# Patient Record
Sex: Female | Born: 2000
Health system: Southern US, Community
[De-identification: ages and names within clinical notes are randomized; demographics above are authoritative.]

---
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
## 2022-03-04 DIAGNOSIS — F410 Panic disorder [episodic paroxysmal anxiety] without agoraphobia: Secondary | ICD-10-CM | POA: Diagnosis not present

## 2022-03-04 DIAGNOSIS — Z720 Tobacco use: Secondary | ICD-10-CM | POA: Diagnosis not present

## 2022-03-04 DIAGNOSIS — R5383 Other fatigue: Secondary | ICD-10-CM | POA: Diagnosis not present

## 2022-03-04 DIAGNOSIS — F411 Generalized anxiety disorder: Secondary | ICD-10-CM | POA: Diagnosis not present

## 2022-03-04 DIAGNOSIS — L659 Nonscarring hair loss, unspecified: Secondary | ICD-10-CM | POA: Diagnosis not present

## 2022-03-09 ENCOUNTER — Other Ambulatory Visit: Payer: Self-pay

## 2022-03-09 MED ORDER — BUSPIRONE HCL 5 MG PO TABS
ORAL_TABLET | ORAL | 1 refills | Status: AC
  Filled 2022-03-09: qty 60, 34d supply, fill #0

## 2022-03-15 ENCOUNTER — Other Ambulatory Visit: Payer: Self-pay

## 2022-03-15 ENCOUNTER — Encounter: Payer: Self-pay | Admitting: Dermatology

## 2022-03-15 ENCOUNTER — Ambulatory Visit: Payer: 59 | Admitting: Dermatology

## 2022-03-15 DIAGNOSIS — L659 Nonscarring hair loss, unspecified: Secondary | ICD-10-CM

## 2022-03-15 DIAGNOSIS — L700 Acne vulgaris: Secondary | ICD-10-CM

## 2022-03-15 MED ORDER — CLINDAMYCIN PHOSPHATE 1 % EX SOLN
1.0000 | Freq: Every day | CUTANEOUS | 2 refills | Status: DC
  Filled 2022-03-15: qty 30, 30d supply, fill #0

## 2022-03-15 MED ORDER — TRETINOIN 0.025 % EX CREA
1.0000 "application " | TOPICAL_CREAM | Freq: Every day | CUTANEOUS | 2 refills | Status: AC
  Filled 2022-03-15: qty 45, 30d supply, fill #0

## 2022-03-15 NOTE — Patient Instructions (Addendum)
Shave Excision Benign Lesion Wound Care Instructions  Leave the original bandage on for 24 hours if possible.  If the bandage becomes soaked or soiled before that time, it is OK to remove it and examine the wound.  A small amount of post-operative bleeding is normal.  If excessive bleeding occurs, remove the bandage, place gauze over the site and apply continuous pressure (no peeking) over the area for 20-30 minutes.  If this does not stop the bleeding, try again for 40 minutes.  If this does not work, please call our clinic as soon as possible (even if after-hours).    Twice a day, cleanse the wound with soap and water.  If a thick crust develops you may use a Q-tip dipped into dilute hydrogen peroxide (mix 1:1 with water) to dissolve it.  Hydrogen peroxide can slow the healing process, so use it only as needed.  After washing, apply Vaseline jelly or Polysporin ointment.  For best healing, the wound should be covered with a layer of ointment at all times.  This may mean re-applying the ointment several times a day.  For open wounds, continue until it has healed.    If you have any swelling, keep the area elevated.  Some redness, tenderness and white or yellow material in the wound is normal healing.  If the area becomes very sore and red, or develops a thick yellow-green material (pus), it may be infected; please notify us.    Wound healing continues for up to one year following surgery.  It is not unusual to experience pain in the scar from time to time during the interval.  If the pain becomes severe or the scar thickens, you should notify the office.  A slight amount of redness in a scar is expected for the first six months.  After six months, the redness subsides and the scar will soften and fade.  The color difference becomes less noticeable with time.  If there are any problems, return for a post-op surgery check at your earliest convenience.  Please call our office for any questions or  concerns.    Topical retinoid medications like tretinoin/Retin-A, adapalene/Differin, tazarotene/Fabior, and Epiduo/Epiduo Forte can cause dryness and irritation when first started. Only apply a pea-sized amount to the entire affected area. Avoid applying it around the eyes, edges of mouth and creases at the nose. If you experience irritation, use a good moisturizer first and/or apply the medicine less often. If you are doing well with the medicine, you can increase how often you use it until you are applying every night. Be careful with sun protection while using this medication as it can make you sensitive to the sun. This medicine should not be used by pregnant women.

## 2022-03-15 NOTE — Progress Notes (Signed)
New Patient Visit  Subjective  Brandi Morton is a 22 y.o. female who presents for the following: Alopecia (Bilateral temple hair thinning. Worse with stress. Takes viviscal x1 yr & has helped. Started Vit D 10,000 IU last week instructed to decrease to 5000 IU after 1 mth. /Father has female pattern hair loss. Reg menses, no hx of PCOS. Tangerine size ovarian cyst Dr. Marcelline Mates at [facility] monitoring since Oct 2023) and Acne (Worse in last 9mhs).    Objective  Well appearing patient in no apparent distress; mood and affect are within normal limits.  A focused examination was performed including scalp. Relevant physical exam findings are noted in the Assessment and Plan.  Chin Inflammatory papules on chin   Right temporal scalp Hair thinning bilateral temples      Assessment & Plan  Acne vulgaris Chin  Apply Clindamycin to whole face in the morning Apply Tretinoin to the whole face in the evening on Monday, Wednesday, and Friday  Daily Regimen Template:  Morning: 1. Wash your face with a gentle cleanser (Cetaphil, CeraVe, Neutrogena) 2. Apply a pea-sized amount of  CLINDAMYCIN 1% LOTION  to face 3. Follow with a moisturizer and sunscreen suitable for acne-prone skin.  Evening: 1. Wash your face with a gentle cleanser (Cetaphil, CeraVe, Neutrogena) 2. Apply a pea-sized amount of  TRETINOIN 0.025% cream  to entire face.  Start only using 2-3 night per week and gradually increase as tolerated. 3. Apply a non-comedogenic moisturizer to keep the skin hydrated overnight (Neutrogena, CeraVe, Cetaphil)  Topical retinoid medications like tretinoin/Retin-A, adapalene/Differin, tazarotene/Fabior, and Epiduo/Epiduo Forte can cause dryness and irritation when first started. Only apply a pea-sized amount to the entire affected area. Avoid applying it around the eyes, edges of mouth and creases at the nose. If you experience irritation, use a good moisturizer first and/or apply the  medicine less often. If you are doing well with the medicine, you can increase how often you use it until you are applying every night. Be careful with sun protection while using this medication as it can make you sensitive to the sun. This medicine should not be used by pregnant women.    tretinoin (RETIN-A) 0.025 % cream - Chin Apply 1 application  topically at bedtime. Apply to whole face on Monday, Wednesday, and Friday in the evening  Nonscarring hair loss Right temporal scalp  [facility]  Skin / nail biopsy - Right temporal scalp Type of biopsy: punch   Informed consent: discussed and consent obtained   Timeout: patient name, date of birth, surgical site, and procedure verified   Procedure prep:  Patient was prepped and draped in usual sterile fashion Prep type:  Isopropyl alcohol Anesthesia: the lesion was anesthetized in a standard fashion   Anesthetic:  1% lidocaine w/ epinephrine 1-100,000 buffered w/ 8.4% NaHCO3 Punch size:  4 mm Suture size:  4-0 Suture type: nylon   Hemostasis achieved with: suture and aluminum chloride   Outcome: patient tolerated procedure well   Post-procedure details: sterile dressing applied and wound care instructions given   Dressing type: petrolatum gauze    DHEA-sulfate - Right temporal scalp  Testosterone, Free, Total, SHBG - Right temporal scalp  Estrogens, Total - Right temporal scalp  Progesterone - Right temporal scalp  Specimen 1 - Surgical pathology Differential Diagnosis: androgenetic alopecia vs telogen effluvium  Check Margins: No   Return in about 2 weeks (around 03/29/2022) for suture removal and biopsy follow  up.   I, Zigmund Gottron, CMA, am acting as scribe for Talbot Grumbling, MD.   Documentation: I have reviewed the above documentation for accuracy and completeness, and I agree with the above  Martin, DO

## 2022-03-24 DIAGNOSIS — R8290 Unspecified abnormal findings in urine: Secondary | ICD-10-CM | POA: Diagnosis not present

## 2022-03-24 DIAGNOSIS — R591 Generalized enlarged lymph nodes: Secondary | ICD-10-CM | POA: Diagnosis not present

## 2022-03-24 DIAGNOSIS — Z03818 Encounter for observation for suspected exposure to other biological agents ruled out: Secondary | ICD-10-CM | POA: Diagnosis not present

## 2022-03-24 DIAGNOSIS — R5383 Other fatigue: Secondary | ICD-10-CM | POA: Diagnosis not present

## 2022-03-29 ENCOUNTER — Encounter: Payer: Self-pay | Admitting: Dermatology

## 2022-04-01 ENCOUNTER — Other Ambulatory Visit
Admission: RE | Admit: 2022-04-01 | Discharge: 2022-04-01 | Disposition: A | Discharge status: Home / Self Care | Payer: 59 | Source: Ambulatory Visit | Attending: Student | Admitting: Student

## 2022-04-01 ENCOUNTER — Other Ambulatory Visit: Payer: Self-pay

## 2022-04-01 ENCOUNTER — Ambulatory Visit: Payer: 59 | Admitting: Dermatology

## 2022-04-01 DIAGNOSIS — R0789 Other chest pain: Secondary | ICD-10-CM | POA: Diagnosis not present

## 2022-04-01 DIAGNOSIS — J209 Acute bronchitis, unspecified: Secondary | ICD-10-CM | POA: Diagnosis not present

## 2022-04-01 DIAGNOSIS — R5383 Other fatigue: Secondary | ICD-10-CM | POA: Diagnosis not present

## 2022-04-01 DIAGNOSIS — R0602 Shortness of breath: Secondary | ICD-10-CM | POA: Insufficient documentation

## 2022-04-01 DIAGNOSIS — R590 Localized enlarged lymph nodes: Secondary | ICD-10-CM | POA: Diagnosis not present

## 2022-04-01 LAB — TROPONIN I (HIGH SENSITIVITY): Troponin I (High Sensitivity): <2 ng/L (ref <18)

## 2022-04-01 LAB — D-DIMER, QUANTITATIVE: D-Dimer, Quant: <0.27 ug/mL-FEU (ref 0.00–0.50)

## 2022-04-01 MED ORDER — CITALOPRAM HYDROBROMIDE 10 MG PO TABS
10.0000 mg | ORAL_TABLET | Freq: Every day | ORAL | 0 refills | Status: DC
  Filled 2022-04-01: qty 30, 30d supply, fill #0

## 2022-04-07 ENCOUNTER — Ambulatory Visit: Payer: 59 | Admitting: Dermatology

## 2022-04-13 ENCOUNTER — Ambulatory Visit
Admission: RE | Admit: 2022-04-13 | Discharge: 2022-04-13 | Disposition: A | Discharge status: Home / Self Care | Payer: 59 | Source: Ambulatory Visit | Attending: Obstetrics and Gynecology | Admitting: Obstetrics and Gynecology

## 2022-04-13 ENCOUNTER — Other Ambulatory Visit: Payer: Self-pay | Admitting: Obstetrics and Gynecology

## 2022-04-13 DIAGNOSIS — Z3009 Encounter for other general counseling and advice on contraception: Secondary | ICD-10-CM

## 2022-04-13 DIAGNOSIS — D271 Benign neoplasm of left ovary: Secondary | ICD-10-CM | POA: Insufficient documentation

## 2022-04-19 ENCOUNTER — Ambulatory Visit: Payer: 59 | Admitting: Dermatology

## 2022-04-19 VITALS — BP 105/73

## 2022-04-19 DIAGNOSIS — L650 Telogen effluvium: Secondary | ICD-10-CM | POA: Diagnosis not present

## 2022-04-19 NOTE — Progress Notes (Signed)
   Follow-Up Visit   Subjective  Brandi Morton is a 22 y.o. female who presents for the following: Biopsy follow up of the scalp. Pathology shows probable Telogen Effluvium. She reports the sutures fell out on their own.    The following portions of the chart were reviewed this encounter and updated as appropriate: medications, allergies, medical history  Review of Systems:  No other skin or systemic complaints except as noted in HPI or Assessment and Plan.  Objective  Well appearing patient in no apparent distress; mood and affect are within normal limits.   A focused examination was performed of the following areas: Scalp  Relevant exam findings are noted in the Assessment and Plan.  Mid Frontal Scalp Diffuse hair thinning    Assessment & Plan     Telogen effluvium Mid Frontal Scalp  -Reviewed pathology report in detail. Reviewed lab reports in detail. All labs within normal limits.  Plan: Counseling I counseled the patient regarding the following: Hair care: Underlying organic causes should be treated. Minoxidil can be helpful in prolonged cases. Expectations: Telogen Effluvium is hair shedding. Triggers include stress, illness, iron deficiency, thyroid disease, and medications. The disease is self-limited and usually resolves within months. Contact Office if: Telogen Effluvium fails to respond to treatment or worsens.  I recommended the following: Advised continuing Viviscal daily. Recommend Seen shampoo and conditioner - samples given today. Topical 5% Minoxidil QAM for about 6 months.     Return in about 2 months (around 06/19/2022) for Acne follow up and Telogen Effluvium follow up.  Jaclynn Guarneri, CMA, am acting as scribe for Langston Reusing, MD.   Documentation: I have reviewed the above documentation for accuracy and completeness, and I agree with the above.  Langston Reusing, MD

## 2022-04-19 NOTE — Patient Instructions (Addendum)
Topical 5% Minoxidil solution or foam in the morning x at least 6 months  Due to recent changes in healthcare laws, you may see results of your pathology and/or laboratory studies on MyChart before the doctors have had a chance to review them. We understand that in some cases there may be results that are confusing or concerning to you. Please understand that not all results are received at the same time and often the doctors may need to interpret multiple results in order to provide you with the best plan of care or course of treatment. Therefore, we ask that you please give [place] 2 business days to thoroughly review all your results before contacting the office for clarification. Should we see a critical lab result, you will be contacted sooner.   If You Need Anything After Your Visit  If you have any questions or concerns for your doctor, please call our main line at [phone] If no one answers, please leave a voicemail as directed and we will return your call as soon as possible. Messages left after 4 pm will be answered the following business day.   You may also send [place] a message via MyChart. We typically respond to MyChart messages within 1-2 business days.  For prescription refills, please ask your pharmacy to contact our office. Our fax number is [phone].  If you have an urgent issue when the clinic is closed that cannot wait until the next business day, you can page your doctor at the number below.    Please note that while we do our best to be available for urgent issues outside of office hours, we are not available 24/7.   If you have an urgent issue and are unable to reach [place], you may choose to seek medical care at your doctor's office, retail clinic, urgent care center, or emergency room.  If you have a medical emergency, please immediately call 911 or go to the emergency department. In the event of inclement weather, please call our main line at [phone] for an update on  the status of any delays or closures.  Dermatology Medication Tips: Please keep the boxes that topical medications come in in order to help keep track of the instructions about where and how to use these. Pharmacies typically print the medication instructions only on the boxes and not directly on the medication tubes.   If your medication is too expensive, please contact our office at [phone] or send [place] a message through MyChart.   We are unable to tell what your co-pay for medications will be in advance as this is different depending on your insurance coverage. However, we may be able to find a substitute medication at lower cost or fill out paperwork to get insurance to cover a needed medication.   If a prior authorization is required to get your medication covered by your insurance company, please allow [place] 1-2 business days to complete this process.  Drug prices often vary depending on where the prescription is filled and some pharmacies may offer cheaper prices.  The website www.goodrx.com contains coupons for medications through different pharmacies. The prices here do not account for what the cost may be with help from insurance (it may be cheaper with your insurance), but the website can give you the price if you did not use any insurance.  - You can print the associated coupon and take it with your prescription to the pharmacy.  - You may also stop by our office during regular  business hours and pick up a GoodRx coupon card.  - If you need your prescription sent electronically to a different pharmacy, notify our office through [facility] or by phone at [phone]

## 2022-04-19 NOTE — Progress Notes (Deleted)
   Follow-Up Visit   Subjective  Brandi Morton is a 22 y.o. female who presents for the following: Biopsy follow up of the scalp. Pathology shows probable Telogen Effluvium. She reports the sutures fell out on their own.    The following portions of the chart were reviewed this encounter and updated as appropriate: medications, allergies, medical history  Review of Systems:  No other skin or systemic complaints except as noted in HPI or Assessment and Plan.  Objective  Well appearing patient in no apparent distress; mood and affect are within normal limits.   A focused examination was performed of the following areas: Scalp   Relevant exam findings are noted in the Assessment and Plan.  Mid Frontal Scalp Diffuse hair thinning    Assessment & Plan   .    Telogen effluvium Mid Frontal Scalp  -Reviewed pathology report in detail. Reviewed lab reports in detail. All labs within normal limits.  Plan: Counseling I counseled the patient regarding the following: Hair care: Underlying organic causes should be treated. Minoxidil can be helpful in prolonged cases. Expectations: Telogen Effluvium is hair shedding. Triggers include stress, illness, iron deficiency, thyroid disease, and medications. The disease is self-limited and usually resolves within months. Contact Office if: Telogen Effluvium fails to respond to treatment or worsens.  I recommended the following: Advised continuing Viviscal daily, Seen shampoo and conditioner. Topical 5% Minoxidil QAM or about 6 months. Sample given today.    Return in about 2 months (around 06/19/2022) for Acne follow up and Telogen Effluvium follow up.    Documentation: I have reviewed the above documentation for accuracy and completeness, and I agree with the above.  Langston Reusing, MD

## 2022-04-22 NOTE — Progress Notes (Signed)
Results were discussed with patient in office. Please update as complete. No additional follow needed.  Sending this message for documentation.  Skin (A), right temporal scalp NONSCARRING ALOPECIA, PROBABLE TELOGEN EFFLUVIUM

## 2022-05-03 ENCOUNTER — Other Ambulatory Visit: Payer: Self-pay

## 2022-05-04 ENCOUNTER — Other Ambulatory Visit: Payer: Self-pay

## 2022-05-04 DIAGNOSIS — F411 Generalized anxiety disorder: Secondary | ICD-10-CM | POA: Diagnosis not present

## 2022-05-04 MED ORDER — CITALOPRAM HYDROBROMIDE 10 MG PO TABS
10.0000 mg | ORAL_TABLET | Freq: Every day | ORAL | 1 refills | Status: DC
  Filled 2022-05-04 – 2022-05-05 (×2): qty 90, 90d supply, fill #0
  Filled 2022-08-30: qty 90, 90d supply, fill #1

## 2022-05-05 ENCOUNTER — Other Ambulatory Visit: Payer: Self-pay

## 2022-05-07 ENCOUNTER — Other Ambulatory Visit: Payer: Self-pay

## 2022-05-10 ENCOUNTER — Other Ambulatory Visit: Payer: Self-pay

## 2022-05-13 ENCOUNTER — Ambulatory Visit: Payer: 59 | Admitting: Dermatology

## 2022-05-26 ENCOUNTER — Encounter: Payer: Self-pay | Admitting: Dermatology

## 2022-05-30 ENCOUNTER — Encounter: Payer: Self-pay | Admitting: Dermatology

## 2022-06-04 ENCOUNTER — Encounter: Payer: Self-pay | Admitting: Dermatology

## 2022-06-08 NOTE — Telephone Encounter (Signed)
Hi Brandi Morton,  Please call pt and reassure her that the pic she shared is a normal amount of hair loss for 1 week.

## 2022-06-09 ENCOUNTER — Telehealth: Payer: Self-pay

## 2022-06-09 NOTE — Telephone Encounter (Signed)
I left the patient a my chart message.

## 2022-06-17 ENCOUNTER — Ambulatory Visit: Payer: 59 | Admitting: Dermatology

## 2022-07-19 ENCOUNTER — Encounter: Payer: Self-pay | Admitting: Obstetrics and Gynecology

## 2022-07-20 ENCOUNTER — Other Ambulatory Visit: Payer: Self-pay | Admitting: Obstetrics and Gynecology

## 2022-07-20 ENCOUNTER — Ambulatory Visit
Admission: RE | Admit: 2022-07-20 | Discharge: 2022-07-20 | Disposition: A | Discharge status: Home / Self Care | Payer: 59 | Source: Ambulatory Visit | Attending: Obstetrics and Gynecology | Admitting: Obstetrics and Gynecology

## 2022-07-20 DIAGNOSIS — Z8742 Personal history of other diseases of the female genital tract: Secondary | ICD-10-CM | POA: Insufficient documentation

## 2022-07-20 DIAGNOSIS — R102 Pelvic and perineal pain: Secondary | ICD-10-CM

## 2022-07-20 DIAGNOSIS — D271 Benign neoplasm of left ovary: Secondary | ICD-10-CM | POA: Diagnosis not present

## 2022-07-20 DIAGNOSIS — N854 Malposition of uterus: Secondary | ICD-10-CM | POA: Diagnosis not present

## 2022-07-20 DIAGNOSIS — Z842 Family history of other diseases of the genitourinary system: Secondary | ICD-10-CM | POA: Diagnosis not present

## 2022-08-08 DIAGNOSIS — S92352A Displaced fracture of fifth metatarsal bone, left foot, initial encounter for closed fracture: Secondary | ICD-10-CM | POA: Diagnosis not present

## 2022-08-08 DIAGNOSIS — S99922A Unspecified injury of left foot, initial encounter: Secondary | ICD-10-CM | POA: Diagnosis not present

## 2022-08-13 ENCOUNTER — Encounter: Payer: Self-pay | Admitting: Podiatry

## 2022-08-13 ENCOUNTER — Other Ambulatory Visit: Payer: Self-pay | Admitting: Podiatry

## 2022-08-13 DIAGNOSIS — M79672 Pain in left foot: Secondary | ICD-10-CM | POA: Diagnosis not present

## 2022-08-13 DIAGNOSIS — S92352A Displaced fracture of fifth metatarsal bone, left foot, initial encounter for closed fracture: Secondary | ICD-10-CM | POA: Diagnosis not present

## 2022-08-18 NOTE — Discharge Instructions (Signed)
[facility]  POST OPERATIVE INSTRUCTIONS FOR DR. Ether Griffins AND DR. BAKER [facility] PODIATRY DEPARTMENT   Take your medication as prescribed.  Pain medication should be taken only as needed.  Keep the dressing clean, dry and intact.  Keep your foot elevated above the heart level for the first 48 hours.  Walking to the bathroom and brief periods of walking are acceptable, unless we have instructed you to be non-weight bearing.  Always wear your post-op shoe when walking.  Always use your crutches if you are to be non-weight bearing.  Do not take a shower. Baths are permissible as long as the foot is kept out of the water.   Every hour you are awake:  Bend your knee 15 times. Flex foot 15 times Massage calf 15 times  Call [facility] [phone]) if any of the following problems occur: You develop a temperature or fever. The bandage becomes saturated with blood. Medication does not stop your pain. Injury of the foot occurs. Any symptoms of infection including redness, odor, or red streaks running from wound.

## 2022-08-19 ENCOUNTER — Ambulatory Visit: Payer: 59 | Admitting: Anesthesiology

## 2022-08-19 ENCOUNTER — Encounter
Admission: RE | Disposition: A | Discharge status: Home / Self Care | Payer: Self-pay | Source: Home / Self Care | Attending: Podiatry

## 2022-08-19 ENCOUNTER — Encounter: Payer: Self-pay | Admitting: Podiatry

## 2022-08-19 ENCOUNTER — Ambulatory Visit
Admission: RE | Admit: 2022-08-19 | Discharge: 2022-08-19 | Disposition: A | Discharge status: Home / Self Care | Payer: 59 | Attending: Podiatry | Admitting: Podiatry

## 2022-08-19 ENCOUNTER — Other Ambulatory Visit: Payer: Self-pay

## 2022-08-19 ENCOUNTER — Ambulatory Visit: Payer: Self-pay

## 2022-08-19 DIAGNOSIS — F32A Depression, unspecified: Secondary | ICD-10-CM | POA: Diagnosis not present

## 2022-08-19 DIAGNOSIS — S92352A Displaced fracture of fifth metatarsal bone, left foot, initial encounter for closed fracture: Secondary | ICD-10-CM

## 2022-08-19 DIAGNOSIS — F419 Anxiety disorder, unspecified: Secondary | ICD-10-CM | POA: Insufficient documentation

## 2022-08-19 DIAGNOSIS — W109XXA Fall (on) (from) unspecified stairs and steps, initial encounter: Secondary | ICD-10-CM | POA: Diagnosis not present

## 2022-08-19 HISTORY — PX: REPAIR EXTENSOR TENDON WITH METATARSAL OSTEOTOMY AND OPEN REDUCTION IN: SHX5698

## 2022-08-19 LAB — POCT PREGNANCY, URINE: Preg Test, Ur: NEGATIVE

## 2022-08-19 SURGERY — REPAIR EXTENSOR TENDON WITH METATARSAL OSTEOTOMY AND OPEN REDUCTION INTERNAL FIXATION (ORIF) METATARSAL
Anesthesia: General | Laterality: Left

## 2022-08-19 MED ORDER — OXYCODONE HCL 5 MG/5ML PO SOLN: 5.0000 mg | Freq: Once | ORAL | Status: DC | PRN

## 2022-08-19 MED ORDER — ONDANSETRON HCL 4 MG PO TABS
4.0000 mg | ORAL_TABLET | Freq: Three times a day (TID) | ORAL | 0 refills | Status: DC | PRN
  Filled 2022-08-19: qty 20, 7d supply, fill #0

## 2022-08-19 MED ORDER — KETOROLAC TROMETHAMINE 15 MG/ML IJ SOLN
INTRAMUSCULAR | Status: DC | PRN
  Administered 2022-08-19: 15 mg via INTRAVENOUS

## 2022-08-19 MED ORDER — OXYCODONE HCL 5 MG PO TABS: 5.0000 mg | ORAL_TABLET | Freq: Once | ORAL | Status: DC | PRN

## 2022-08-19 MED ORDER — LIDOCAINE HCL (CARDIAC) PF 100 MG/5ML IV SOSY
PREFILLED_SYRINGE | INTRAVENOUS | Status: DC | PRN
  Administered 2022-08-19: 60 mg via INTRAVENOUS

## 2022-08-19 MED ORDER — GLYCOPYRROLATE 0.2 MG/ML IJ SOLN
INTRAMUSCULAR | Status: DC | PRN
Start: 2022-08-19 — End: 2022-08-19
  Administered 2022-08-19: .2 mg via INTRAVENOUS

## 2022-08-19 MED ORDER — BUPIVACAINE LIPOSOME 1.3 % IJ SUSP
INTRAMUSCULAR | Status: DC | PRN
  Administered 2022-08-19: 10 mL

## 2022-08-19 MED ORDER — FENTANYL CITRATE (PF) 100 MCG/2ML IJ SOLN
INTRAMUSCULAR | Status: DC | PRN
  Administered 2022-08-19 (×2): 25 ug via INTRAVENOUS
  Administered 2022-08-19: 50 ug via INTRAVENOUS

## 2022-08-19 MED ORDER — BUPIVACAINE HCL 0.5 % IJ SOLN
INTRAMUSCULAR | Status: DC | PRN
  Administered 2022-08-19: 16 mL

## 2022-08-19 MED ORDER — DEXAMETHASONE SODIUM PHOSPHATE 4 MG/ML IJ SOLN
INTRAMUSCULAR | Status: DC | PRN
  Administered 2022-08-19: 4 mg via INTRAVENOUS

## 2022-08-19 MED ORDER — LACTATED RINGERS IV SOLN: INTRAVENOUS | Status: DC | PRN

## 2022-08-19 MED ORDER — 0.9 % SODIUM CHLORIDE (POUR BTL) OPTIME
TOPICAL | Status: DC | PRN
  Administered 2022-08-19 (×2): 1000 mL

## 2022-08-19 MED ORDER — KETOROLAC TROMETHAMINE 60 MG/2ML IM SOLN
INTRAMUSCULAR | Status: DC | PRN
  Administered 2022-08-19: 15 mg via INTRAMUSCULAR

## 2022-08-19 MED ORDER — DEXMEDETOMIDINE HCL IN NACL 200 MCG/50ML IV SOLN
INTRAVENOUS | Status: DC | PRN
  Administered 2022-08-19: 8 ug via INTRAVENOUS

## 2022-08-19 MED ORDER — CEFAZOLIN SODIUM-DEXTROSE 2-4 GM/100ML-% IV SOLN
2.0000 g | INTRAVENOUS | Status: AC
  Administered 2022-08-19: 2 g via INTRAVENOUS

## 2022-08-19 MED ORDER — HYDROCODONE-ACETAMINOPHEN 5-325 MG PO TABS
1.0000 | ORAL_TABLET | Freq: Four times a day (QID) | ORAL | 0 refills | Status: AC | PRN
  Filled 2022-08-19: qty 28, 7d supply, fill #0

## 2022-08-19 MED ORDER — MIDAZOLAM HCL 2 MG/2ML IJ SOLN
INTRAMUSCULAR | Status: DC | PRN
  Administered 2022-08-19: 2 mg via INTRAVENOUS

## 2022-08-19 MED ORDER — ONDANSETRON HCL 4 MG/2ML IJ SOLN
INTRAMUSCULAR | Status: DC | PRN
  Administered 2022-08-19: 4 mg via INTRAVENOUS

## 2022-08-19 MED ORDER — PHENYLEPHRINE HCL (PRESSORS) 10 MG/ML IV SOLN
INTRAVENOUS | Status: DC | PRN
  Administered 2022-08-19: 120 ug via INTRAVENOUS
  Administered 2022-08-19: 80 ug via INTRAVENOUS
  Administered 2022-08-19 (×2): 40 ug via INTRAVENOUS

## 2022-08-19 MED ORDER — PROPOFOL 10 MG/ML IV BOLUS
INTRAVENOUS | Status: DC | PRN
Start: 2022-08-19 — End: 2022-08-19
  Administered 2022-08-19: 20 mg via INTRAVENOUS
  Administered 2022-08-19: 50 mg via INTRAVENOUS
  Administered 2022-08-19: 30 mg via INTRAVENOUS
  Administered 2022-08-19: 200 mg via INTRAVENOUS

## 2022-08-19 MED ORDER — ASPIRIN 81 MG PO TBEC
81.0000 mg | DELAYED_RELEASE_TABLET | Freq: Two times a day (BID) | ORAL | 0 refills | Status: AC
  Filled 2022-08-19: qty 28, 14d supply, fill #0

## 2022-08-19 SURGICAL SUPPLY — 46 items
APL SKNCLS STERI-STRIP NONHPOA (GAUZE/BANDAGES/DRESSINGS) ×1
BENZOIN TINCTURE PRP APPL 2/3 (GAUZE/BANDAGES/DRESSINGS) IMPLANT
BIT DRILL 1.3 (BIT) ×1
BIT DRILL 100X1.3XAO CNCT (BIT) IMPLANT
BIT DRL 100X1.3XAO CNCT (BIT) ×1
BNDG CMPR STD VLCR NS LF 5.8X4 (GAUZE/BANDAGES/DRESSINGS) ×1
BNDG CMPR STD VLCR NS LF 5.8X6 (GAUZE/BANDAGES/DRESSINGS) ×1
BNDG ELASTIC 4X5.8 VLCR NS LF (GAUZE/BANDAGES/DRESSINGS) ×2 IMPLANT
BNDG ELASTIC 6X5.8 VLCR NS LF (GAUZE/BANDAGES/DRESSINGS) ×2 IMPLANT
BNDG ESMARCH 4 X 12 STRL LF (GAUZE/BANDAGES/DRESSINGS) ×1
BNDG ESMARCH 4X12 STRL LF (GAUZE/BANDAGES/DRESSINGS) ×2 IMPLANT
BNDG GAUZE DERMACEA FLUFF 4 (GAUZE/BANDAGES/DRESSINGS) ×2 IMPLANT
BNDG GZE DERMACEA 4 6PLY (GAUZE/BANDAGES/DRESSINGS) ×1
CANISTER SUCT 1200ML W/VALVE (MISCELLANEOUS) ×2 IMPLANT
COVER LIGHT HANDLE UNIVERSAL (MISCELLANEOUS) ×4 IMPLANT
DRAPE FLUOR MINI C-ARM 54X84 (DRAPES) ×2 IMPLANT
DURAPREP 26ML APPLICATOR (WOUND CARE) ×2 IMPLANT
ELECT REM PT RETURN 9FT ADLT (ELECTROSURGICAL) ×1
ELECTRODE REM PT RTRN 9FT ADLT (ELECTROSURGICAL) ×2 IMPLANT
GAUZE SPONGE 4X4 12PLY STRL (GAUZE/BANDAGES/DRESSINGS) ×2 IMPLANT
GAUZE XEROFORM 1X8 LF (GAUZE/BANDAGES/DRESSINGS) ×2 IMPLANT
GLOVE BIOGEL PI IND STRL 7.5 (GLOVE) ×2 IMPLANT
GLOVE SURG SS PI 7.0 STRL IVOR (GLOVE) ×2 IMPLANT
GOWN STRL REUS W/ TWL LRG LVL3 (GOWN DISPOSABLE) ×4 IMPLANT
GOWN STRL REUS W/TWL LRG LVL3 (GOWN DISPOSABLE) ×2
KIT TURNOVER KIT A (KITS) ×2 IMPLANT
LAPIPLASTY SYS 4A (Orthopedic Implant) IMPLANT
NS IRRIG 500ML POUR BTL (IV SOLUTION) ×2 IMPLANT
PACK EXTREMITY ARMC (MISCELLANEOUS) ×2 IMPLANT
PAD ABD DERMACEA PRESS 5X9 (GAUZE/BANDAGES/DRESSINGS) IMPLANT
PADDING CAST BLEND 4X4 NS (MISCELLANEOUS) ×6 IMPLANT
PLATE T 8H (Plate) IMPLANT
SCREW 2.0 X10 LOCKING (Screw) IMPLANT
SCREW 2.7 HIGH PITCH LOCKING (Screw) IMPLANT
SCREW HIGH PITCH LOCK 2.7 (Screw) IMPLANT
SCREW LOCKING 2.0X12 (Screw) IMPLANT
SPLINT CAST 1 STEP 4X30 (MISCELLANEOUS) ×2 IMPLANT
STOCKINETTE IMPERVIOUS LG (DRAPES) ×2 IMPLANT
STRIP CLOSURE SKIN 1/4X4 (GAUZE/BANDAGES/DRESSINGS) IMPLANT
SUT MNCRL 4-0 (SUTURE) ×1
SUT MNCRL 4-0 27XMFL (SUTURE) ×1
SUT VIC AB 3-0 SH 27 (SUTURE) ×1
SUT VIC AB 3-0 SH 27X BRD (SUTURE) IMPLANT
SUTURE MNCRL 4-0 27XMF (SUTURE) IMPLANT
T-PLATE 8H (Plate) ×1 IMPLANT
WIRE OLIVE SMOOTH 1.3 (WIRE) IMPLANT

## 2022-08-19 NOTE — Transfer of Care (Signed)
Immediate Anesthesia Transfer of Care Note  Patient: Brandi Morton  Procedure(s) Performed: OPEN REDUCTION INTERNAL FIXATION (ORIF) METATARSAL (Left)  Patient Location: PACU  Anesthesia Type:General  Level of Consciousness: awake, alert , and oriented  Airway & Oxygen Therapy: Patient Spontanous Breathing and Patient connected to face mask oxygen  Post-op Assessment: Report given to RN and Post -op Vital signs reviewed and stable  Post vital signs: Reviewed and stable  Last Vitals: See PACU flow sheet.  Temp wnl. Vitals Value Taken Time  BP 119/87 08/19/22 0942  Temp    Pulse 75 08/19/22 0945  Resp 16 08/19/22 0945  SpO2 100 % 08/19/22 0945  Vitals shown include unfiled device data.  Last Pain:  Vitals:   08/19/22 0730  TempSrc: Temporal  PainSc: 2          Complications: No notable events documented.

## 2022-08-19 NOTE — Anesthesia Procedure Notes (Signed)
Procedure Name: LMA Insertion Date/Time: 08/19/2022 8:21 AM  Performed by: Genia Del, CRNAPre-anesthesia Checklist: Patient identified, Patient being monitored, Timeout performed, Emergency Drugs available and Suction available Patient Re-evaluated:Patient Re-evaluated prior to induction Oxygen Delivery Method: Circle system utilized Preoxygenation: Pre-oxygenation with 100% oxygen Induction Type: IV induction Ventilation: Mask ventilation without difficulty LMA: LMA inserted LMA Size: 4.0 Tube type: Oral Number of attempts: 1 Placement Confirmation: positive ETCO2 and breath sounds checked- equal and bilateral Tube secured with: Tape Dental Injury: Teeth and Oropharynx as per pre-operative assessment  Comments: Ambu Auraonce placed easily with good seal.

## 2022-08-19 NOTE — Anesthesia Preprocedure Evaluation (Signed)
Anesthesia Evaluation  Patient identified by MRN, date of birth, ID band Patient awake    Reviewed: Allergy & Precautions, NPO status , Patient's Chart, lab work & pertinent test results  History of Anesthesia Complications Negative for: history of anesthetic complications  Airway Mallampati: II  TM Distance: >3 FB Neck ROM: full    Dental no notable dental hx.    Pulmonary neg pulmonary ROS   Pulmonary exam normal        Cardiovascular negative cardio ROS Normal cardiovascular exam     Neuro/Psych  Headaches PSYCHIATRIC DISORDERS Anxiety Depression       GI/Hepatic negative GI ROS, Neg liver ROS,,,  Endo/Other  negative endocrine ROS    Renal/GU      Musculoskeletal   Abdominal   Peds  Hematology negative hematology ROS (+)   Anesthesia Other Findings History reviewed. No pertinent past medical history.  Past Surgical History: 04/25/2021: ARTERY EXPLORATION; Right     Comment:  Procedure: RIGHT HAND EXPLORATION, ULNAR ARTERY AND               NERVE REPAIR;  Surgeon: Gomez Cleverly, MD;  Location: [facility]               OR;  Service: Orthopedics;  Laterality: Right; 12/2013: OTHER SURGICAL HISTORY; Right     Comment:  Broken leg repair under anesthesia  BMI    Body Mass Index: 22.51 kg/m      Reproductive/Obstetrics negative OB ROS                             Anesthesia Physical Anesthesia Plan  ASA: 2  Anesthesia Plan: General LMA   Post-op Pain Management: Regional block   Induction: Intravenous  PONV Risk Score and Plan: Dexamethasone, Ondansetron, Midazolam and Treatment may vary due to age or medical condition  Airway Management Planned: LMA  Additional Equipment:   Intra-op Plan:   Post-operative Plan: Extubation in OR  Informed Consent: I have reviewed the patients History and Physical, chart, labs and discussed the procedure including the risks, benefits and  alternatives for the proposed anesthesia with the patient or authorized representative who has indicated his/her understanding and acceptance.     Dental Advisory Given  Plan Discussed with: Anesthesiologist, CRNA and Surgeon  Anesthesia Plan Comments: (Patient consented for risks of anesthesia including but not limited to:  - adverse reactions to medications - damage to eyes, teeth, lips or other oral mucosa - nerve damage due to positioning  - sore throat or hoarseness - Damage to heart, brain, nerves, lungs, other parts of body or loss of life  Patient voiced understanding.)       Anesthesia Quick Evaluation

## 2022-08-19 NOTE — Op Note (Signed)
PODIATRY / FOOT AND ANKLE SURGERY OPERATIVE REPORT    SURGEON: Rosetta Posner, DPM  PRE-OPERATIVE DIAGNOSIS:  1.  Left fifth metatarsal fracture closed, displaced, comminuted  POST-OPERATIVE DIAGNOSIS: Same  PROCEDURE(S): Left fifth metatarsal fracture open reduction with internal fixation  HEMOSTASIS: Left ankle tourniquet  ANESTHESIA: general  ESTIMATED BLOOD LOSS: 5 cc  FINDING(S): 1.  Comminuted displaced extra-articular closed fracture of the left fifth metatarsal midshaft  PATHOLOGY/SPECIMEN(S): None  INDICATIONS:   Brandi Morton is a 22 y.o. female who presents with a displaced closed comminuted fracture of the left fifth metatarsal distally after sustaining an injury.  Patient was seen in the walk-in clinic at [facility] clinic and was noted to have this fracture and was placed into a boot and was instructed on partial weightbearing to nonweightbearing with use of crutches.  Patient presented thereafter for further evaluation in podiatry clinic.  All treatment options were discussed with the patient both conservative and surgical attempts at correction include potential risk and complications at this time patient is elected for surgical intervention consisting of left fifth metatarsal fracture open reduction with internal fixation.  Consent obtained.  No guarantees given..  DESCRIPTION: After obtaining full informed written consent, the patient was brought back to the operating room and placed supine upon the operating table.  The patient received IV antibiotics prior to induction.  After obtaining adequate anesthesia, the patient was prepped and draped in the standard fashion.  15 cc of half percent Marcaine plain was direct about the left fifth ray proximally.-Magrinat is used to exsanguinate the left extremity pneumatic ankle tourniquet was inflated.  Attention was then directed to the left fifth metatarsal dorsal laterally and incision was made over the midshaft and  proximal shaft area extending to the fifth metatarsal phalangeal joint lateral to the tendon of the extensor digitorum longus.  The incision was deepened to the subcutaneous tissues utilizing sharp dissection care was taken to identify and retract all vital neural and vascular structures no venous contributories were cauterized necessary.  At this time a periosteal and capsular incision was made lateral to the tendon of the extensor digitorum longus.  The periosteum and capsular tissue was reflected medially and laterally at the operative site thereby exposing the fifth metatarsal head, neck, distal shaft and proximal shaft.  The fracture was easily able to be visualized and mobile at this time with no healing at this point.  The fracture appeared to be comminuted with a butterfly fragment present laterally, the fracture appeared to be shortened and laterally rotated and slightly dorsiflexed.  Debridement was performed of the fracture site with a curette removing any fibrous debris to the area.  This appeared to mobilize the fracture further.  At this time the fracture was reduced and held in the reduced position and fixated with a reduction clamp.  Reduction was checked under fluoroscopic guidance and noted be near anatomic.  At this time a Paragon 28 7 hole T plate was placed with the appropriate orientation with 2 screws going into the head of the fifth metatarsal and the remaining going through the shaft.  This was temporary fixated with all of wires and checked under fluoroscopic guidance and noted to sit excellently.  At this time screws were placed into the fifth metatarsal head x 2, 2.0 locking screws.  3 proximal screws then placed in the most proximal portions of the plate away from the fracture that were 2.0 of screws locking as well.  The fracture appeared to be well  fixated and in anatomic position clinically and radiographically.  The olive wires were removed for screw fixation purposes during this  course of the procedure.  Final fluoroscopic picture was then obtained showing reduction of the fifth metatarsal fracture in near-anatomic position with excellent alignment of the screws and plate.  The surgical site was flushed with copious amounts normal sterile saline.  The capsular and periosteal tissue was reapproximated well coapted with 3-0 Vicryl.  The subcutaneous tissues were approximated well coapted with 4-0 Monocryl.  A subcuticular skin closure was then obtained with 4-0 Monocryl in a running type pattern.  Benzoin and Steri-Strips were applied.  An additional 10 cc of Exparel was injected about the operative site.  A postoperative dressing was applied consisting of Xeroform to the incisional area followed by 4 x 4 gauze, ABD, Kerlix, Ace wrap and tall cam boot.  The pneumatic ankle tourniquet was deflated during this and a prompt hyper response was noted all distal left foot.  Following.  Postoperative monitoring the patient be discharged home with the appropriate orders, instructions, and medications.  Patient is to remain nonweightbearing to the left lower extremity all times for the next 2 weeks.  COMPLICATIONS: None  CONDITION: Good, stable  Rosetta Posner, dpm

## 2022-08-19 NOTE — Anesthesia Postprocedure Evaluation (Signed)
Anesthesia Post Note  Patient: Gwendalyn Ege Harral  Procedure(s) Performed: OPEN REDUCTION INTERNAL FIXATION (ORIF) METATARSAL (Left)  Patient location during evaluation: PACU Anesthesia Type: General Level of consciousness: awake and alert Pain management: pain level controlled Vital Signs Assessment: post-procedure vital signs reviewed and stable Respiratory status: spontaneous breathing, nonlabored ventilation, respiratory function stable and patient connected to nasal cannula oxygen Cardiovascular status: blood pressure returned to baseline and stable Postop Assessment: no apparent nausea or vomiting Anesthetic complications: no   No notable events documented.   Last Vitals:  Vitals:   08/19/22 0955 08/19/22 1000  BP:  106/80  Pulse: 76 68  Resp: (!) 22 14  Temp:  36.6 C  SpO2: 99% 98%    Last Pain:  Vitals:   08/19/22 0945  TempSrc:   PainSc: 0-No pain                 Louie Boston

## 2022-08-19 NOTE — H&P (Signed)
HISTORY AND PHYSICAL INTERVAL NOTE:  08/19/2022  7:22 AM  Brandi Morton  has presented today for surgery, with the diagnosis of S92.352 - Displaced fracture fifth metatarsal M79.672 - Left foot pain.  The various methods of treatment have been discussed with the patient.  No guarantees were given.  After consideration of risks, benefits and other options for treatment, the patient has consented to surgery.  I have reviewed the patients' chart and labs.    PROCEDURE: LEFT 5TH METATARSAL FRACTURE ORIF  A history and physical examination was performed in my office.  The patient was reexamined.  There have been no changes to this history and physical examination.  Rosetta Posner, DPM

## 2022-08-25 DIAGNOSIS — Z09 Encounter for follow-up examination after completed treatment for conditions other than malignant neoplasm: Secondary | ICD-10-CM | POA: Diagnosis not present

## 2022-08-25 DIAGNOSIS — M79672 Pain in left foot: Secondary | ICD-10-CM | POA: Diagnosis not present

## 2022-11-03 DIAGNOSIS — M79672 Pain in left foot: Secondary | ICD-10-CM | POA: Diagnosis not present

## 2022-11-03 DIAGNOSIS — S92352D Displaced fracture of fifth metatarsal bone, left foot, subsequent encounter for fracture with routine healing: Secondary | ICD-10-CM | POA: Diagnosis not present

## 2022-11-23 ENCOUNTER — Other Ambulatory Visit: Payer: Self-pay

## 2022-11-23 MED ORDER — CITALOPRAM HYDROBROMIDE 10 MG PO TABS
10.0000 mg | ORAL_TABLET | Freq: Every day | ORAL | 0 refills | Status: DC
  Filled 2022-11-23: qty 90, 90d supply, fill #0

## 2022-12-17 ENCOUNTER — Other Ambulatory Visit: Payer: Self-pay

## 2022-12-17 DIAGNOSIS — M79641 Pain in right hand: Secondary | ICD-10-CM | POA: Diagnosis not present

## 2022-12-17 DIAGNOSIS — M79642 Pain in left hand: Secondary | ICD-10-CM | POA: Diagnosis not present

## 2022-12-17 MED ORDER — METHYLPREDNISOLONE 4 MG PO TBPK
ORAL_TABLET | ORAL | 0 refills | Status: DC
  Filled 2022-12-17: qty 21, 6d supply, fill #0

## 2022-12-28 ENCOUNTER — Other Ambulatory Visit: Payer: Self-pay

## 2023-02-09 ENCOUNTER — Other Ambulatory Visit: Payer: Self-pay

## 2023-02-09 MED ORDER — CITALOPRAM HYDROBROMIDE 10 MG PO TABS
10.0000 mg | ORAL_TABLET | Freq: Every day | ORAL | 0 refills | Status: DC
  Filled 2023-05-13: qty 30, 30d supply, fill #0

## 2023-02-18 ENCOUNTER — Other Ambulatory Visit: Payer: Self-pay

## 2023-02-18 DIAGNOSIS — R058 Other specified cough: Secondary | ICD-10-CM | POA: Diagnosis not present

## 2023-02-18 DIAGNOSIS — F419 Anxiety disorder, unspecified: Secondary | ICD-10-CM | POA: Diagnosis not present

## 2023-02-18 DIAGNOSIS — R509 Fever, unspecified: Secondary | ICD-10-CM | POA: Diagnosis not present

## 2023-02-18 DIAGNOSIS — Z03818 Encounter for observation for suspected exposure to other biological agents ruled out: Secondary | ICD-10-CM | POA: Diagnosis not present

## 2023-02-18 MED ORDER — OSELTAMIVIR PHOSPHATE 75 MG PO CAPS
ORAL_CAPSULE | ORAL | 0 refills | Status: DC
  Filled 2023-02-18: qty 10, 5d supply, fill #0

## 2023-03-01 ENCOUNTER — Other Ambulatory Visit: Payer: Self-pay

## 2023-05-13 ENCOUNTER — Other Ambulatory Visit: Payer: Self-pay

## 2023-06-14 ENCOUNTER — Other Ambulatory Visit: Payer: Self-pay

## 2023-06-14 DIAGNOSIS — R238 Other skin changes: Secondary | ICD-10-CM | POA: Diagnosis not present

## 2023-06-14 MED ORDER — VALACYCLOVIR HCL 1 G PO TABS
1000.0000 mg | ORAL_TABLET | Freq: Two times a day (BID) | ORAL | 0 refills | Status: DC
  Filled 2023-06-14: qty 20, 10d supply, fill #0

## 2023-06-14 NOTE — Progress Notes (Signed)
 History of Present Illness:   [name] is a 23 y.o. female here for   Verbally consented to the use of AI for note-taking.   Chief Complaint  Patient presents with  . Rash    1 year, buttocks and bikini line.     History of Present Illness Brandi Morton is a 23 year old female who presents with persistent bumps on her buttocks and thighs.  Approximately a year ago, she noticed the onset of bumps, initially appearing as three small pimples in the crease of her buttocks. Over time, these bumps have persisted and have recently begun to spread, particularly as the weather has warmed.  The bumps are located on the sides of her legs, inner thighs, and buttocks. They do not itch, burn, or cause pain. Some bumps appear white, while others are red. She has not observed any similar lesions elsewhere on her body.  She shaves her legs and inner thighs but has never shaved her buttocks. She denies any recent changes in underwear, body wash, detergents, or lotions that could account for the skin changes.  She has had the same boyfriend for six months. She is unsure if her previous partner had any sexually transmitted infections, as she did not observe any symptoms on him.  No fever, chills, or flu-like symptoms at the time of the initial outbreak that she can remember, noting that last summer was very busy for her.   Past Medical History:   Past Medical History:  Diagnosis Date  . Yzjijryz(215.9)     Past Surgical History:  History reviewed. No pertinent surgical history.  Allergies:  No Known Allergies  Current Medications:   Prior to Admission medications   Medication Sig Taking? Last Dose  citalopram  (CELEXA ) 10 MG tablet Take 1 tablet (10 mg total) by mouth once daily Yes Taking  aspirin  81 MG EC tablet Take by mouth    valACYclovir  (VALTREX ) 1000 MG tablet Take 1 tablet (1,000 mg total) by mouth 2 (two) times daily for 10 days      Family History:  No family  history on file.  Social History:   Social History   Socioeconomic History  . Marital status: Single  Tobacco Use  . Smoking status: Never    Passive exposure: Never  . Smokeless tobacco: Never  Vaping Use  . Vaping status: Every Day  . Last attempt to quit: 02/09/2022  Substance and Sexual Activity  . Alcohol use: Not Currently  . Drug use: Never  . Sexual activity: Yes    Partners: Male    Birth control/protection: Condom   Social Drivers of Health   Financial Resource Strain: Low Risk  (02/18/2023)   Overall Financial Resource Strain (CARDIA)   . Difficulty of Paying Living Expenses: Not hard at all  Food Insecurity: No Food Insecurity (02/18/2023)   Hunger Vital Sign   . Worried About Programme researcher, broadcasting/film/video in the Last Year: Never true   . Ran Out of Food in the Last Year: Never true  Transportation Needs: No Transportation Needs (02/18/2023)   PRAPARE - Transportation   . Lack of Transportation (Medical): No   . Lack of Transportation (Non-Medical): No  Housing Stability: Unknown (02/18/2023)   Housing Stability Vital Sign   . Unable to Pay for Housing in the Last Year: No   . Homeless in the Last Year: No    Review of Systems:   A 10 point review of systems is negative, except for  the pertinent positives and negatives detailed in the HPI.  Vitals:   Vitals:   06/14/23 1354  BP: 118/74  Pulse: 96  SpO2: 98%  Weight: 71.9 kg (158 lb 9.6 oz)  Height: 172.7 cm (5' 8)     Body mass index is 24.12 kg/m.  Physical Exam:   Physical Exam Vitals and nursing note reviewed. Exam conducted with a chaperone present.  Constitutional:      General: She is not in acute distress.    Appearance: Normal appearance. She is not ill-appearing, toxic-appearing or diaphoretic.  HENT:     Head: Normocephalic and atraumatic.     Right Ear: External ear normal.     Left Ear: External ear normal.  Eyes:     General:        Right eye: No discharge.        Left eye: No discharge.      Conjunctiva/sclera: Conjunctivae normal.  Cardiovascular:     Rate and Rhythm: Normal rate and regular rhythm.     Pulses: Normal pulses.  Pulmonary:     Effort: Pulmonary effort is normal.  Skin:    General: Skin is warm and dry.     Findings: Rash present. Rash is vesicular.          Comments: Vesicular rash to inner thighs and right inner buttock.  No pain or tenderness upon palpation.  No itching  Neurological:     Mental Status: She is alert.     Assessment and Plan:  No results found for this visit on 06/14/23.  Diagnoses and all orders for this visit:  Vesicular rash -     HSV 1/2 PCR - [facility]  Other orders -     valACYclovir  (VALTREX ) 1000 MG tablet; Take 1 tablet (1,000 mg total) by mouth 2 (two) times daily for 10 days   Assessment & Plan Vesicular rash Presents with persistent red blister-like lesions on the inner thighs and buttocks for approximately one year, with recent spread.   - Perform swab test on lesions to test for HSV  - Prescribe Valtrex  to assess if lesions resolve, supporting HSV diagnosis. - Educate on HSV's chronic nature and management with Valtrex  to suppress symptoms and reduce outbreaks.   Follow up She will call the office with any new or worsening symptoms   This note has been created using automated tools and reviewed for accuracy by provider.  Patient received an After Visit Summary    Attestation Statement:   I personally performed the service, non-incident to. (WP)   GLENDA MACARIO HADDOCK, NP

## 2023-06-21 DIAGNOSIS — R238 Other skin changes: Secondary | ICD-10-CM | POA: Diagnosis not present

## 2023-06-23 NOTE — Progress Notes (Signed)
 ENCOUNTER: Patient Class :No patient class for patient encounter Department: [facility] CLINIC [address] [place] [address]  PATIENT: Patient Demographics      Name Patient ID SSN Gender Identity Birth Date   Brandi Morton, Brandi Morton [id number] Female 2000/09/18 (22 yrs)          Address Phone Email       [address] [place] [address] [phone] (H) [email or web address]         [facility] Caucasian/White             Reg Status PCP Date Last Verified Next Review Date     Verified Harvey Gaetana Helling [phone] 06/14/23 07/14/23           Marital Status Religion Language       Single Unknown-Patient Declined English              EMERGENCY CONTACT: Name Relationship Lgl Grd Work Marine scientist Phone  1. [place],BRITTAIN Parent   [phone]     GUARANTOR: There is no guarantor information entered for this encounter.  COVERAGE: Primary Visit Coverage      Payer Plan Group Number Group Name Payer Phone Plan Phone   No coverage found                Secondary Visit Coverage      Payer Plan Group Number Group Name Payer Phone Plan Phone   No coverage found                Primary Coverage      Payer Plan Group Number Group Name Payer Phone Plan Phone   AETNA AETNA[employer] [facility] SAVE PLAN [id number] [employer] [facility]  [phone]           Primary Subscriber      Subscriber ID Subscriber Name Subscriber [facility] Subscriber Address   [id number] [facility]  [address]      [place], [place] [address]           Secondary Coverage      Payer Plan Group Number Group Name Payer Phone Plan Phone   No coverage found

## 2023-06-27 ENCOUNTER — Encounter: Payer: Self-pay | Admitting: Dermatology

## 2023-06-27 ENCOUNTER — Ambulatory Visit (INDEPENDENT_AMBULATORY_CARE_PROVIDER_SITE_OTHER): Admitting: Dermatology

## 2023-06-27 VITALS — BP 103/66 | HR 107

## 2023-06-27 DIAGNOSIS — B081 Molluscum contagiosum: Secondary | ICD-10-CM | POA: Diagnosis not present

## 2023-06-27 DIAGNOSIS — L650 Telogen effluvium: Secondary | ICD-10-CM

## 2023-06-27 NOTE — Patient Instructions (Addendum)
 Date: Mon Jun 27 2023  Hello Brandi Morton,  Thank you for visiting today. Here is a summary of the key instructions:  - Medications:   - Take cimetidine (Tagamet) daily   - Continue for 4 weeks after bumps are gone  - Home Treatments:   - Apply hydrogen peroxide to affected areas twice daily   - Apply Aquaphor after hydrogen peroxide   - Wear breathable, form-fitting clothing like boy shorts or cover them with Band-Aid or Paper-tape   - Lifestyle Changes:   - Change pajamas daily   - Don't reuse towels   - Take showers instead of baths   - Avoid sexual activity until bumps are gone   - Use sunscreen on affected areas to prevent scarring   - Avoid sun exposure on healing areas  - Follow-up:   - Return in 6 weeks for check-up   - Freezing treatment may be done if bumps haven't improved  Please reach out if you have any questions or concerns.  Warm regards,  Dr. Louana Roup, Dermatology         Important Information   Due to recent changes in healthcare laws, you may see results of your pathology and/or laboratory studies on MyChart before the doctors have had a chance to review them. We understand that in some cases there may be results that are confusing or concerning to you. Please understand that not all results are received at the same time and often the doctors may need to interpret multiple results in order to provide you with the best plan of care or course of treatment. Therefore, we ask that you please give us  2 business days to thoroughly review all your results before contacting the office for clarification. Should we see a critical lab result, you will be contacted sooner.     If You Need Anything After Your Visit   If you have any questions or concerns for your doctor, please call our main line at [phone]. If no one answers, please leave a voicemail as directed and we will return your call as soon as possible. Messages left after 4 pm will be answered the  following business day.    You may also send us  a message via MyChart. We typically respond to MyChart messages within 1-2 business days.  For prescription refills, please ask your pharmacy to contact our office. Our fax number is [phone].  If you have an urgent issue when the clinic is closed that cannot wait until the next business day, you can page your doctor at the number below.     Please note that while we do our best to be available for urgent issues outside of office hours, we are not available 24/7.    If you have an urgent issue and are unable to reach us , you may choose to seek medical care at your doctor's office, retail clinic, urgent care center, or emergency room.   If you have a medical emergency, please immediately call 911 or go to the emergency department. In the event of inclement weather, please call our main line at [phone] for an update on the status of any delays or closures.  Dermatology Medication Tips: Please keep the boxes that topical medications come in in order to help keep track of the instructions about where and how to use these. Pharmacies typically print the medication instructions only on the boxes and not directly on the medication tubes.   If your medication is too expensive, please  contact our office at [phone] or send us  a message through MyChart.    We are unable to tell what your co-pay for medications will be in advance as this is different depending on your insurance coverage. However, we may be able to find a substitute medication at lower cost or fill out paperwork to get insurance to cover a needed medication.    If a prior authorization is required to get your medication covered by your insurance company, please allow us  1-2 business days to complete this process.   Drug prices often vary depending on where the prescription is filled and some pharmacies may offer cheaper prices.   The website www.goodrx.com contains coupons  for medications through different pharmacies. The prices here do not account for what the cost may be with help from insurance (it may be cheaper with your insurance), but the website can give you the price if you did not use any insurance.  - You can print the associated coupon and take it with your prescription to the pharmacy.  - You may also stop by our office during regular business hours and pick up a GoodRx coupon card.  - If you need your prescription sent electronically to a different pharmacy, notify our office through [facility] or by phone at [phone]

## 2023-06-27 NOTE — Progress Notes (Signed)
 Follow-Up Visit   Subjective  Brandi Morton is a 23 y.o. female who presents for the following: Telogen Effluvium & bumps on inner thighs  Patient present today for follow up visit for Telogen Effluvium & Acne. Patient was last evaluated on 04/19/22. At this visit patient was recommended to continue OTC Vivascal, Seen Shampoo and conditioner. Patient was also recommended to apply otc Minoxidil. Patient reports sxs are better. Patient denies medication changes.  Patient reports hse has bumps appearing on her inner thighs she would like to have evaluated. Patient reports the areas can be itchy but not always itchy. Patient reports the areas for about 1.5 years. She has not been treated for the areas.   The following portions of the chart were reviewed this encounter and updated as appropriate: medications, allergies, medical history  Review of Systems:  No other skin or systemic complaints except as noted in HPI or Assessment and Plan.  Objective  Well appearing patient in no apparent distress; mood and affect are within normal limits.  A focused examination was performed of the following areas: Inner thighs and buttocks  Relevant exam findings are noted in the Assessment and Plan.    Assessment & Plan   MOLLUSCUM CONTAGIOSUM Exam: Smooth, pink/flesh dome-shaped papules with central umbilication - Discussed viral etiology and contagion.  Molluscum are small wart-like bumps caused by a viral infection in the skin and is easily spread.  It may be more common and more easily spread in children who have eczema, because of dry inflamed skin and frequent scratching.  Use your prescription topical eczema medication as directed if prescribed.  Recommend routine use of mild soap and moisturizing cream to prevent spread.  Do not share towels.  Multiple treatments may be required to clear molluscum.  New spots may occur, even when treated ones clear.   - Assessment:  Patient presents with bumps  and lumps on thighs, initially misdiagnosed as herpes. Clinical examination reveals characteristic appearance of molluscum contagiosum, a viral skin infection. Condition present for approximately 18 months, with recent exacerbation in the past 3 months. Spread likely due to factors such as sweating, rubbing, and skin-to-skin contact. Patient reports hair shedding has subsided and is regrowing. Some lesions showing signs of spontaneous resolution, indicating immune system is beginning to recognize and clear the infection.  - Plan:    Start cimetidine (Tagamet) over-the-counter     - Continue until lesions resolve and for 4 additional weeks to prevent recurrence    Apply hydrogen peroxide to affected areas twice daily     - Follow with Aquaphor application (preferably spray form) to protect skin    Lifestyle modifications:     - Avoid re-wearing pajamas     - Change towels daily     - Take showers instead of baths     - Wear supportive, breathable clothing (e.g., biker shorts, boy shorts)    Avoid sexual activity until lesions resolve    Apply sunscreen to affected areas to prevent scarring    Follow-up in 6 weeks     - If lesions persist, consider cryotherapy    Once resolved, consider retinoid and moisturizer for residual scarring   TELOGEN EFFLUVIUM Exam: Resolved Diffuse thinning of hair, positive hair pull test.  Telogen effluvium is a benign, self-limited condition causing increased hair shedding usually for several months. It does not progress to baldness, and the hair eventually grows back on its own. It can be triggered by recent illness, recent surgery, thyroid   disease, low iron stores, vitamin D deficiency, fad diets or rapid weight loss, hormonal changes such as pregnancy or birth control pills, and some medication. Usually the hair loss starts 2-3 months after the illness or health change. Rarely, it can continue for longer than a year. Treatments options may include oral or topical  Minoxidil; Red Light scalp treatments; Biotin 2.5 mg daily and other options.  Treatment Plan: - Recommended continuing otc Vivascal    Return in about 6 weeks (around 08/08/2023) for Molluscum F/U.  I, Jetta Ager, am acting as Neurosurgeon for [employer], DO.  Documentation: I have reviewed the above documentation for accuracy and completeness, and I agree with the above.  Louana Roup, DO

## 2023-07-11 ENCOUNTER — Other Ambulatory Visit: Payer: Self-pay

## 2023-07-11 MED ORDER — CITALOPRAM HYDROBROMIDE 10 MG PO TABS
10.0000 mg | ORAL_TABLET | Freq: Every day | ORAL | 0 refills | Status: DC
  Filled 2023-07-11: qty 30, 30d supply, fill #0

## 2023-07-13 ENCOUNTER — Other Ambulatory Visit: Payer: Self-pay

## 2023-07-19 ENCOUNTER — Other Ambulatory Visit: Payer: Self-pay

## 2023-07-19 ENCOUNTER — Ambulatory Visit
Admission: EM | Admit: 2023-07-19 | Discharge: 2023-07-19 | Disposition: A | Discharge status: Home / Self Care | Attending: Emergency Medicine | Admitting: Emergency Medicine

## 2023-07-19 DIAGNOSIS — N898 Other specified noninflammatory disorders of vagina: Secondary | ICD-10-CM | POA: Insufficient documentation

## 2023-07-19 MED ORDER — METRONIDAZOLE 500 MG PO TABS
500.0000 mg | ORAL_TABLET | Freq: Two times a day (BID) | ORAL | 0 refills | Status: DC
  Filled 2023-07-19: qty 14, 7d supply, fill #0

## 2023-07-19 NOTE — ED Triage Notes (Signed)
 Patient to Urgent Care with complaints of a possible retained tampon- reports that she can't remember removing a tampon on July 4th. Having some brown/ foul smelling discharge.

## 2023-07-19 NOTE — Discharge Instructions (Addendum)
 Your vaginal tests are pending.  Take the metronidazole  as directed.  Follow-up with your gynecologist as scheduled tomorrow.

## 2023-07-19 NOTE — ED Provider Notes (Signed)
 Brandi Morton    CSN: [id number] Arrival date & time: 07/19/23  1355      History   Chief Complaint Chief Complaint  Patient presents with   Foreign Body in Vagina    HPI Brandi Morton is a 23 y.o. female.  Accompanied by her mother, patient presents with concern for possible retained tampon in her vagina.  She reports vaginal odor.  She is just finishing her menstrual cycle and has some residual red-brown vaginal discharge.  She states she is worried that she may have missed taking out a tampon before putting in a new one approximately 3 days ago but she is not sure.  She denies fever, chills, abdominal pain, dysuria, hematuria, pelvic pain.  She has an appointment scheduled with her gynecologist tomorrow morning.  The history is provided by the patient, a parent and medical records.    History reviewed. No pertinent past medical history.  Patient Active Problem List   Diagnosis Date Noted   Allergy, unspecified, sequela 11/16/2020   Acute recurrent maxillary sinusitis 11/16/2020   Headache 11/16/2020   Personal history of urinary (tract) infections 11/16/2020   Alcohol abuse 11/11/2020   Normal weight, pediatric, BMI 5th to 84th percentile for age 75/01/2020   Vaping nicotine  dependence, tobacco product 11/11/2020   Persistent depressive disorder 04/10/2019   Panic attacks 04/10/2019   Anxiety 04/10/2019   Migraine without aura and without status migrainosus, not intractable 10/02/2018   Episodic tension-type headache, not intractable 10/02/2018   Family history of migraine 10/02/2018    Past Surgical History:  Procedure Laterality Date   ARTERY EXPLORATION Right 04/25/2021   Procedure: RIGHT HAND EXPLORATION, ULNAR ARTERY AND NERVE REPAIR;  Surgeon: Alyse Agent, MD;  Location: [facility] OR;  Service: Orthopedics;  Laterality: Right;   OTHER SURGICAL HISTORY Right 12/2013   Broken leg repair under anesthesia   REPAIR EXTENSOR TENDON WITH METATARSAL OSTEOTOMY  AND OPEN REDUCTION IN Left 08/19/2022   Procedure: OPEN REDUCTION INTERNAL FIXATION (ORIF) METATARSAL;  Surgeon: Lennie Barter, DPM;  Location: [facility];  Service: Orthopedics/Podiatry;  Laterality: Left;    OB History   No obstetric history on file.      Home Medications    Prior to Admission medications   Medication Sig Start Date End Date Taking? Authorizing Provider  metroNIDAZOLE  (FLAGYL ) 500 MG tablet Take 1 tablet (500 mg total) by mouth 2 (two) times daily. 07/19/23  Yes Corlis Burnard DEL, NP  cetirizine (ZYRTEC) 10 MG tablet Take 10 mg by mouth daily as needed for allergies. Patient not taking: Reported on 06/27/2023    [provider]  citalopram  (CELEXA ) 10 MG tablet Take 1 tablet (10 mg total) by mouth once daily 07/11/23     clindamycin  (CLEOCIN  T) 1 % external solution Apply 1 Application topically daily. Apply to whole face in the morning Patient not taking: Reported on 06/27/2023 03/15/22   Alm Delon SAILOR, DO  ibuprofen (ADVIL) 200 MG tablet Take 800 mg by mouth every 6 (six) hours as needed for headache or moderate pain.    [provider]  methylPREDNISolone  (MEDROL  DOSEPAK) 4 MG TBPK tablet Follow package directions. Patient not taking: Reported on 06/27/2023 12/17/22     Multiple Vitamins-Minerals (MULTIVITAMIN GUMMIES WOMENS PO) Take 2 tablets by mouth daily. Patient not taking: Reported on 06/27/2023    [provider]  ondansetron  (ZOFRAN ) 4 MG tablet Take 1 tablet (4 mg total) by mouth every 8 (eight) hours as needed for nausea or  vomiting. Patient not taking: Reported on 06/27/2023 08/19/22   Lennie Barter, DPM  oseltamivir  (TAMIFLU ) 75 MG capsule Take 1 capsule (75 mg total) by mouth 2 (two) times daily for 5 days Patient not taking: Reported on 06/27/2023 02/18/23     valACYclovir  (VALTREX ) 1000 MG tablet Take 1 tablet (1,000 mg total) by mouth 2 (two) times daily for 10 days. Patient not taking: Reported on 06/27/2023 06/14/23       Family  History Family History  Problem Relation Age of Onset   Migraines Mother    Migraines Father    Epilepsy Other     Social History Social History   Tobacco Use   Smoking status: Never    Passive exposure: Never   Smokeless tobacco: Never  Substance Use Topics   Alcohol use: Yes   Drug use: No     Allergies   Other   Review of Systems Review of Systems  Constitutional:  Negative for chills and fever.  Gastrointestinal:  Negative for abdominal pain and nausea.  Genitourinary:  Positive for vaginal bleeding and vaginal discharge. Negative for dysuria, hematuria and pelvic pain.     Physical Exam Triage Vital Signs ED Triage Vitals  Encounter Vitals Group     BP 07/19/23 1424 117/81     Girls Systolic BP Percentile --      Girls Diastolic BP Percentile --      Boys Systolic BP Percentile --      Boys Diastolic BP Percentile --      Pulse Rate 07/19/23 1424 98     Resp 07/19/23 1424 18     Temp 07/19/23 1424 97.9 F (36.6 C)     Temp src --      SpO2 07/19/23 1424 98 %     Weight --      Height --      Head Circumference --      Peak Flow --      Pain Score 07/19/23 1419 0     Pain Loc --      Pain Education --      Exclude from Growth Chart --    No data found.  Updated Vital Signs BP 117/81   Pulse 98   Temp 97.9 F (36.6 C)   Resp 18   LMP 07/12/2023   SpO2 98%   Visual Acuity Right Eye Distance:   Left Eye Distance:   Bilateral Distance:    Right Eye Near:   Left Eye Near:    Bilateral Near:     Physical Exam Exam conducted with a chaperone present [place], Charity fundraiser).  Constitutional:      General: She is not in acute distress. HENT:     Mouth/Throat:     Mouth: Mucous membranes are moist.  Cardiovascular:     Rate and Rhythm: Normal rate and regular rhythm.  Pulmonary:     Effort: Pulmonary effort is normal. No respiratory distress.  Abdominal:     General: Bowel sounds are normal.     Palpations: Abdomen is soft.     Tenderness:  There is no abdominal tenderness. There is no right CVA tenderness, left CVA tenderness, guarding or rebound.  Genitourinary:    Exam position: Lithotomy position.     Vagina: No foreign body. Bleeding present. No tenderness.     Cervix: Normal.     Uterus: Normal.      Adnexa: Right adnexa normal and left adnexa normal.     Comments: Small  amount of red-brown vaginal discharge.  Strong fishy vaginal odor. No foreign body noted with speculum and manual exam.   Neurological:     Mental Status: She is alert.      UC Treatments / Results  Labs (all labs ordered are listed, but only abnormal results are displayed) Labs Reviewed  CERVICOVAGINAL ANCILLARY ONLY    EKG   Radiology No results found.  Procedures Procedures (including critical care time)  Medications Ordered in UC Medications - No data to display  Initial Impression / Assessment and Plan / UC Course  I have reviewed the triage vital signs and the nursing notes.  Pertinent labs & imaging results that were available during my care of the patient were reviewed by me and considered in my medical decision making (see chart for details).    Vaginal odor.  Patient is concerned for possible retained tampon in her vagina.  No foreign body noted on exam today.  Patient does have strong fishy vaginal odor.  Discussed the possibility for bacterial vaginitis.  Vaginal swab obtained for cytology including STD testing.  Treating today with metronidazole .  Instructed her to follow-up with her gynecologist as scheduled tomorrow.  Discussed with patient that her test results will be available in her MyChart and that we will call her if additional treatment is needed.  Education provided on bacterial vaginitis.  She agrees to plan of care.  Final Clinical Impressions(s) / UC Diagnoses   Final diagnoses:  Vaginal odor     Discharge Instructions      Your vaginal tests are pending.  Take the metronidazole  as directed.  Follow-up  with your gynecologist as scheduled tomorrow.     ED Prescriptions     Medication Sig Dispense Auth. Provider   metroNIDAZOLE  (FLAGYL ) 500 MG tablet Take 1 tablet (500 mg total) by mouth 2 (two) times daily. 14 tablet Corlis Burnard DEL, NP      PDMP not reviewed this encounter.   Corlis Burnard DEL, NP 07/19/23 [phone]

## 2023-07-20 LAB — CERVICOVAGINAL ANCILLARY ONLY
Bacterial Vaginitis (gardnerella): POSITIVE — AB
Candida Glabrata: NEGATIVE
Candida Vaginitis: NEGATIVE
Chlamydia: NEGATIVE
Comment: NEGATIVE
Comment: NEGATIVE
Comment: NEGATIVE
Comment: NEGATIVE
Comment: NEGATIVE
Comment: NORMAL
Neisseria Gonorrhea: NEGATIVE
Trichomonas: NEGATIVE

## 2023-07-21 ENCOUNTER — Ambulatory Visit (HOSPITAL_COMMUNITY): Payer: Self-pay

## 2023-08-10 ENCOUNTER — Ambulatory Visit: Admitting: Dermatology

## 2023-08-24 ENCOUNTER — Other Ambulatory Visit: Payer: Self-pay

## 2023-08-24 MED ORDER — CITALOPRAM HYDROBROMIDE 10 MG PO TABS
10.0000 mg | ORAL_TABLET | Freq: Every day | ORAL | 0 refills | Status: DC
  Filled 2023-08-24: qty 30, 30d supply, fill #0

## 2023-09-09 DIAGNOSIS — F419 Anxiety disorder, unspecified: Secondary | ICD-10-CM | POA: Diagnosis not present

## 2023-09-09 DIAGNOSIS — F341 Dysthymic disorder: Secondary | ICD-10-CM | POA: Diagnosis not present

## 2023-09-28 ENCOUNTER — Telehealth: Admitting: Physician Assistant

## 2023-09-28 ENCOUNTER — Other Ambulatory Visit: Payer: Self-pay

## 2023-09-28 DIAGNOSIS — R3989 Other symptoms and signs involving the genitourinary system: Secondary | ICD-10-CM

## 2023-09-28 MED ORDER — NITROFURANTOIN MONOHYD MACRO 100 MG PO CAPS
100.0000 mg | ORAL_CAPSULE | Freq: Two times a day (BID) | ORAL | 0 refills | Status: DC
  Filled 2023-09-28: qty 10, 5d supply, fill #0

## 2023-09-28 NOTE — Progress Notes (Signed)

## 2023-10-16 ENCOUNTER — Other Ambulatory Visit: Payer: Self-pay

## 2023-10-17 ENCOUNTER — Other Ambulatory Visit: Payer: Self-pay

## 2023-10-17 IMAGING — CR DG CHEST 2V
2 series · 2 of 2 positions shown · non-contrast
Comparison: None.

CLINICAL DATA: Chest pain, dyspnea

EXAM:
CHEST - 2 VIEW

[chest pa]
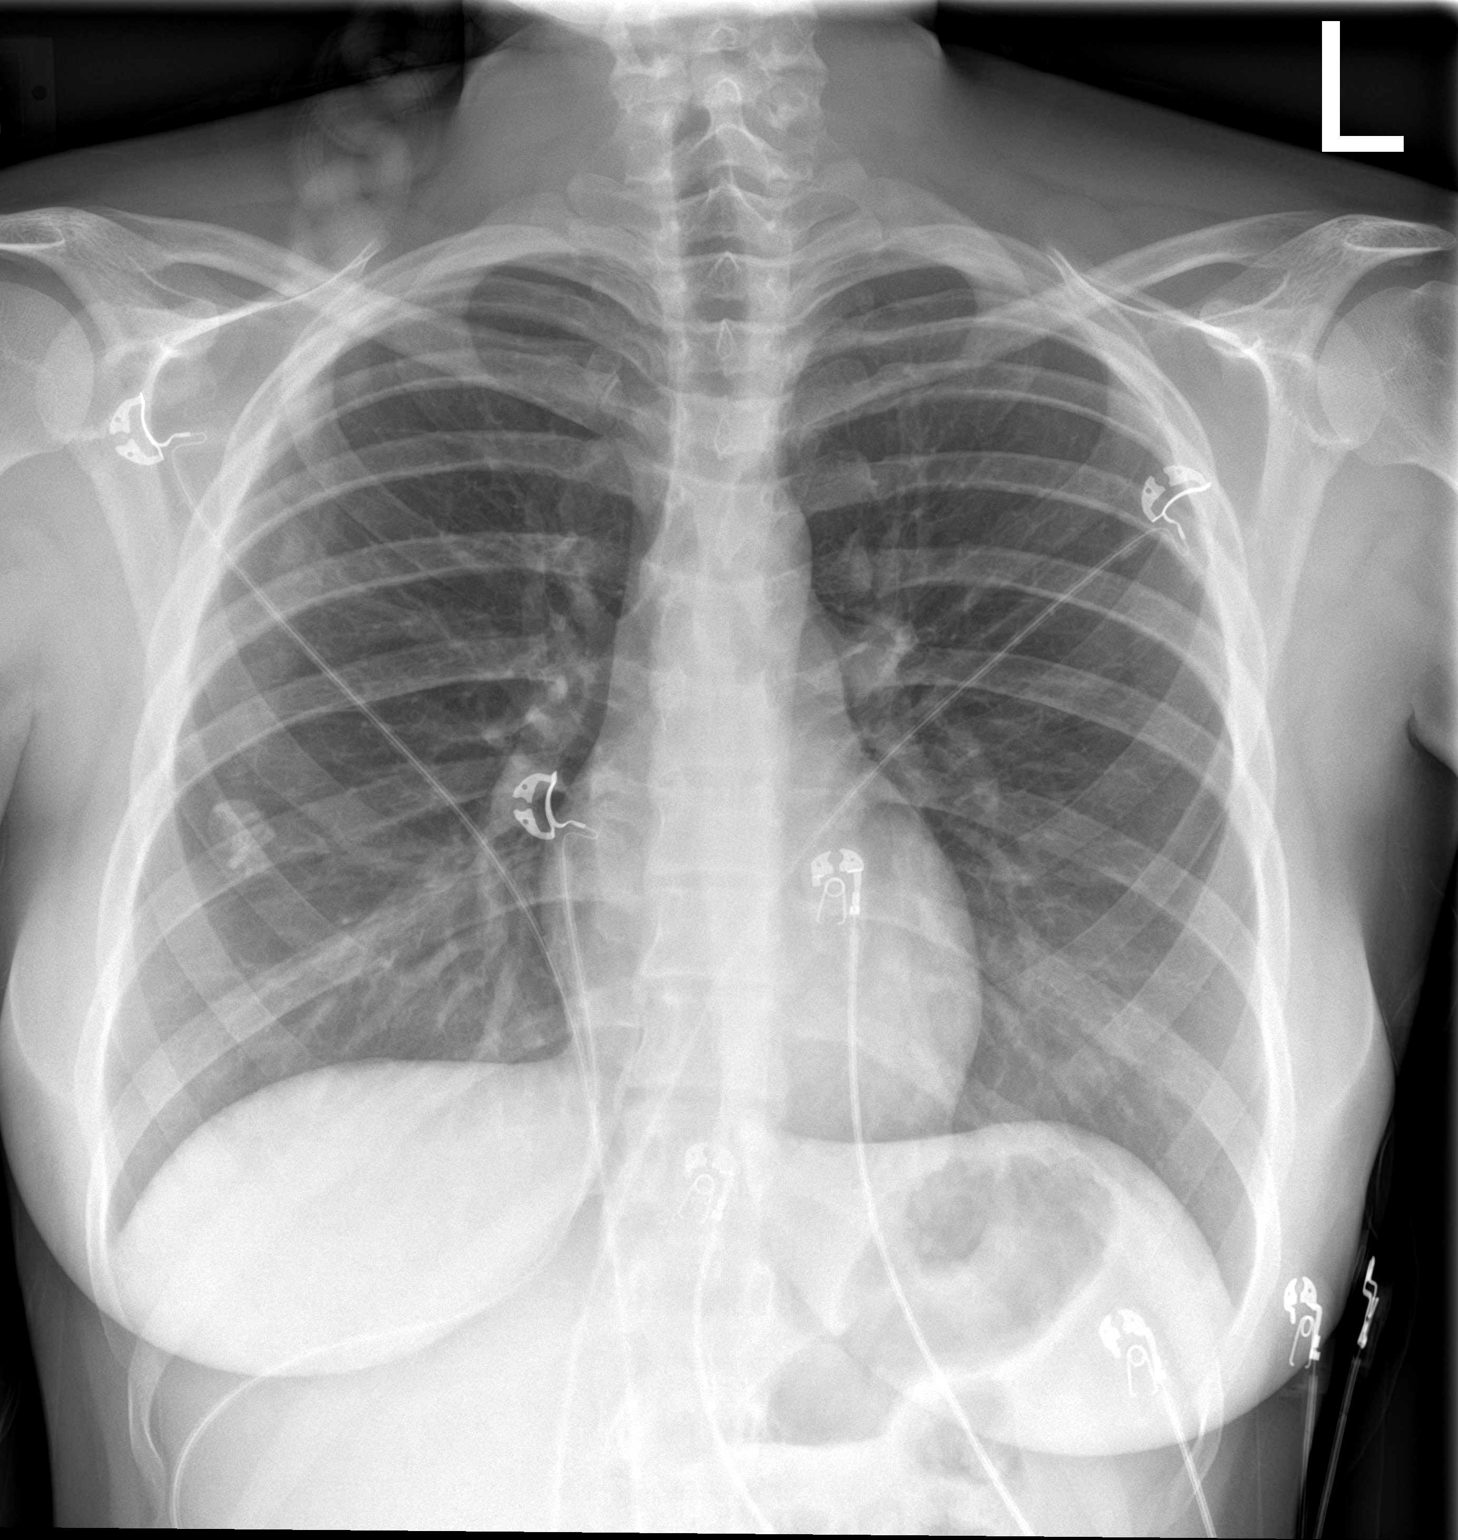

[chest lat]
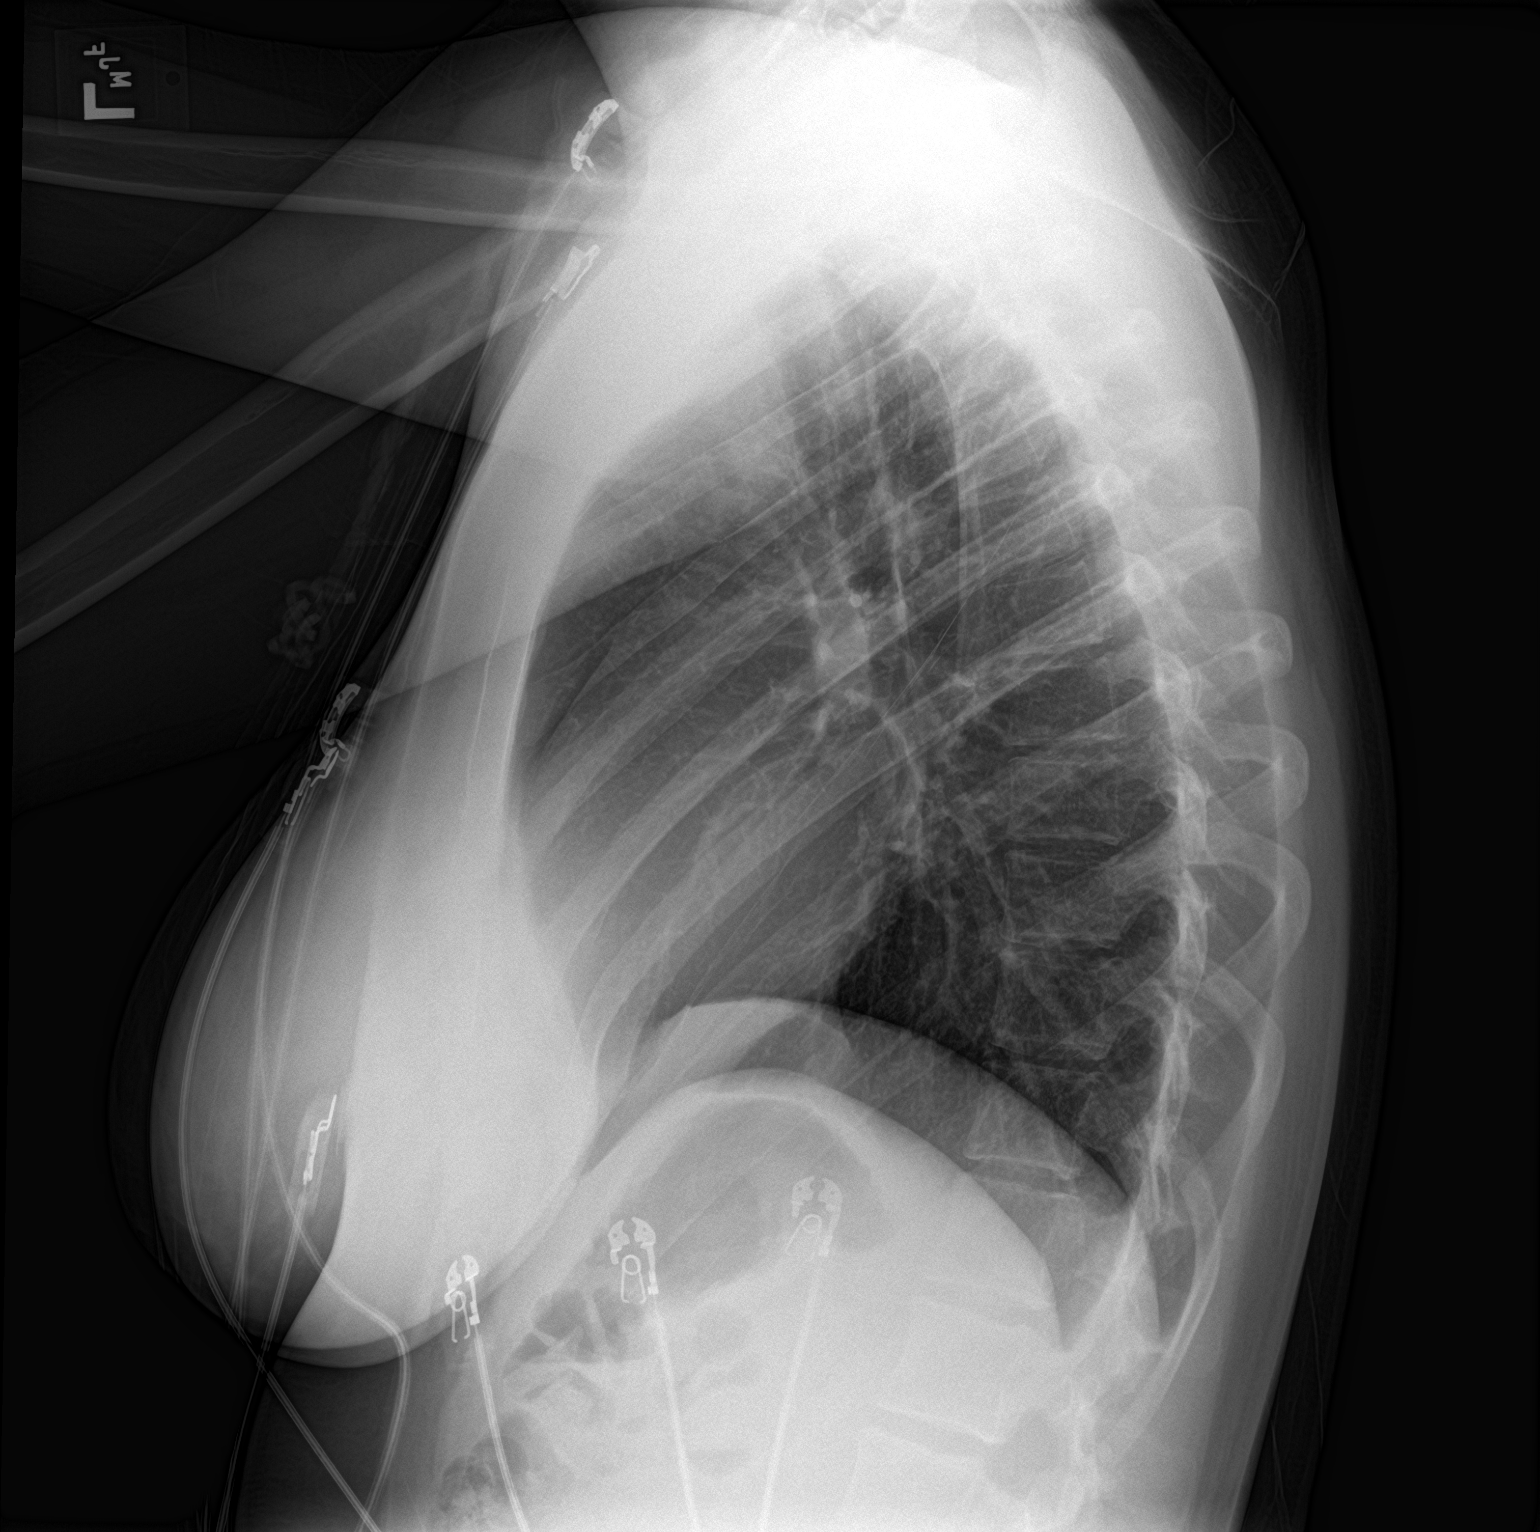

[2 of 2 positions shown; findings below may reference images not displayed]

FINDINGS: The heart size and mediastinal contours are within normal limits.
Both lungs are clear. The visualized skeletal structures are
unremarkable.
IMPRESSION: No active cardiopulmonary disease.

## 2023-10-17 MED ORDER — CITALOPRAM HYDROBROMIDE 10 MG PO TABS
10.0000 mg | ORAL_TABLET | Freq: Every day | ORAL | 0 refills | Status: DC
  Filled 2023-10-17 – 2023-11-04 (×2): qty 30, 30d supply, fill #0

## 2023-10-27 ENCOUNTER — Other Ambulatory Visit: Payer: Self-pay

## 2023-11-04 ENCOUNTER — Other Ambulatory Visit: Payer: Self-pay

## 2023-11-08 ENCOUNTER — Other Ambulatory Visit: Payer: Self-pay

## 2023-11-08 DIAGNOSIS — S161XXA Strain of muscle, fascia and tendon at neck level, initial encounter: Secondary | ICD-10-CM | POA: Diagnosis not present

## 2023-11-08 DIAGNOSIS — M542 Cervicalgia: Secondary | ICD-10-CM | POA: Diagnosis not present

## 2024-01-03 ENCOUNTER — Other Ambulatory Visit: Payer: Self-pay

## 2024-01-03 ENCOUNTER — Telehealth: Admitting: Physician Assistant

## 2024-01-03 DIAGNOSIS — R3989 Other symptoms and signs involving the genitourinary system: Secondary | ICD-10-CM

## 2024-01-03 MED ORDER — NITROFURANTOIN MONOHYD MACRO 100 MG PO CAPS
100.0000 mg | ORAL_CAPSULE | Freq: Two times a day (BID) | ORAL | 0 refills | Status: AC
  Filled 2024-01-03: qty 14, 7d supply, fill #0

## 2024-01-03 NOTE — Progress Notes (Signed)

## 2024-01-19 ENCOUNTER — Other Ambulatory Visit: Payer: Self-pay

## 2024-01-19 ENCOUNTER — Encounter: Payer: Self-pay | Admitting: Pharmacy Technician

## 2024-01-19 MED ORDER — CITALOPRAM HYDROBROMIDE 10 MG PO TABS
10.0000 mg | ORAL_TABLET | Freq: Every day | ORAL | 0 refills | Status: AC
  Filled 2024-01-19: qty 30, 30d supply, fill #0

## 2024-01-23 ENCOUNTER — Other Ambulatory Visit: Payer: Self-pay
